# Patient Record
Sex: Male | Born: 2000 | Hispanic: Yes | Marital: Single | Smoking: Never smoker
Health system: Southern US, Community
[De-identification: ages and names within clinical notes are randomized; demographics above are authoritative.]

## PROBLEM LIST (undated history)

## (undated) DIAGNOSIS — R519 Headache, unspecified: Secondary | ICD-10-CM

## (undated) DIAGNOSIS — R51 Headache: Secondary | ICD-10-CM

## (undated) HISTORY — DX: Headache, unspecified: R51.9

## (undated) HISTORY — PX: CYST REMOVAL NECK: SHX6281

## (undated) HISTORY — PX: CIRCUMCISION: SUR203

## (undated) HISTORY — DX: Headache: R51

---
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
## 2019-04-05 ENCOUNTER — Ambulatory Visit: Payer: Medicaid Other | Attending: Internal Medicine

## 2019-04-05 DIAGNOSIS — Z20822 Contact with and (suspected) exposure to covid-19: Secondary | ICD-10-CM

## 2019-04-07 LAB — NOVEL CORONAVIRUS, NAA: SARS-CoV-2, NAA: NOT DETECTED
